# Patient Record
Sex: Male | Born: 1955 | Race: White | Hispanic: No | Marital: Married | Smoking: Never smoker
Health system: Southern US, Community
[De-identification: ages and names within clinical notes are randomized; demographics above are authoritative.]

## PROBLEM LIST (undated history)

## (undated) DIAGNOSIS — N200 Calculus of kidney: Secondary | ICD-10-CM

## (undated) DIAGNOSIS — C801 Malignant (primary) neoplasm, unspecified: Secondary | ICD-10-CM

## (undated) DIAGNOSIS — H9190 Unspecified hearing loss, unspecified ear: Secondary | ICD-10-CM

## (undated) DIAGNOSIS — E079 Disorder of thyroid, unspecified: Secondary | ICD-10-CM

## (undated) HISTORY — PX: COLONOSCOPY: SHX174

---

## 1975-06-06 HISTORY — PX: TONSILLECTOMY: SUR1361

## 1999-06-06 HISTORY — PX: TESTICLE REMOVAL: SHX68

## 2002-06-05 HISTORY — PX: KNEE ARTHROSCOPY: SHX127

## 2005-04-10 ENCOUNTER — Ambulatory Visit: Payer: Self-pay | Admitting: Hematology & Oncology

## 2005-05-08 ENCOUNTER — Ambulatory Visit (HOSPITAL_COMMUNITY): Admission: RE | Admit: 2005-05-08 | Discharge: 2005-05-08 | Payer: Self-pay | Admitting: Hematology & Oncology

## 2005-10-13 ENCOUNTER — Ambulatory Visit: Payer: Self-pay | Admitting: Hematology & Oncology

## 2006-04-27 ENCOUNTER — Ambulatory Visit: Payer: Self-pay | Admitting: Hematology & Oncology

## 2006-04-30 ENCOUNTER — Ambulatory Visit (HOSPITAL_COMMUNITY): Admission: RE | Admit: 2006-04-30 | Discharge: 2006-04-30 | Payer: Self-pay | Admitting: Hematology & Oncology

## 2006-06-05 HISTORY — PX: LITHOTRIPSY: SUR834

## 2007-04-25 ENCOUNTER — Ambulatory Visit: Payer: Self-pay | Admitting: Hematology & Oncology

## 2007-04-29 ENCOUNTER — Ambulatory Visit (HOSPITAL_COMMUNITY): Admission: RE | Admit: 2007-04-29 | Discharge: 2007-04-29 | Payer: Self-pay | Admitting: Hematology & Oncology

## 2007-04-29 LAB — CBC WITH DIFFERENTIAL/PLATELET
Basophils Absolute: 0 10*3/uL (ref 0.0–0.1)
EOS%: 2.3 % (ref 0.0–7.0)
Eosinophils Absolute: 0.1 10*3/uL (ref 0.0–0.5)
HCT: 38.2 % — ABNORMAL LOW (ref 38.7–49.9)
HGB: 12.7 g/dL — ABNORMAL LOW (ref 13.0–17.1)
MCH: 21.9 pg — ABNORMAL LOW (ref 28.0–33.4)
MCV: 65.8 fL — ABNORMAL LOW (ref 81.6–98.0)
MONO%: 7.4 % (ref 0.0–13.0)
NEUT#: 3.1 10*3/uL (ref 1.5–6.5)
NEUT%: 62.6 % (ref 40.0–75.0)
Platelets: 209 10*3/uL (ref 145–400)

## 2007-04-29 LAB — COMPREHENSIVE METABOLIC PANEL
AST: 34 U/L (ref 0–37)
Albumin: 4.6 g/dL (ref 3.5–5.2)
Alkaline Phosphatase: 54 U/L (ref 39–117)
BUN: 21 mg/dL (ref 6–23)
Calcium: 10 mg/dL (ref 8.4–10.5)
Creatinine, Ser: 1.1 mg/dL (ref 0.40–1.50)
Glucose, Bld: 112 mg/dL — ABNORMAL HIGH (ref 70–99)
Potassium: 4.5 mEq/L (ref 3.5–5.3)

## 2008-04-14 ENCOUNTER — Ambulatory Visit: Payer: Self-pay | Admitting: Hematology & Oncology

## 2008-04-16 ENCOUNTER — Ambulatory Visit (HOSPITAL_COMMUNITY): Admission: RE | Admit: 2008-04-16 | Discharge: 2008-04-16 | Payer: Self-pay | Admitting: Hematology & Oncology

## 2008-04-16 LAB — CBC WITH DIFFERENTIAL/PLATELET
Basophils Absolute: 0 10*3/uL (ref 0.0–0.1)
Eosinophils Absolute: 0.1 10*3/uL (ref 0.0–0.5)
HCT: 38.7 % (ref 38.7–49.9)
HGB: 12.5 g/dL — ABNORMAL LOW (ref 13.0–17.1)
LYMPH%: 27.5 % (ref 14.0–48.0)
MCV: 67.8 fL — ABNORMAL LOW (ref 81.6–98.0)
MONO#: 0.4 10*3/uL (ref 0.1–0.9)
MONO%: 7.2 % (ref 0.0–13.0)
NEUT#: 3.1 10*3/uL (ref 1.5–6.5)
NEUT%: 63.8 % (ref 40.0–75.0)
Platelets: 223 10*3/uL (ref 145–400)
RBC: 5.71 10*6/uL (ref 4.20–5.71)

## 2008-04-16 LAB — COMPREHENSIVE METABOLIC PANEL
ALT: 32 U/L (ref 0–53)
AST: 28 U/L (ref 0–37)
Albumin: 4.6 g/dL (ref 3.5–5.2)
Alkaline Phosphatase: 46 U/L (ref 39–117)
Calcium: 9.9 mg/dL (ref 8.4–10.5)
Chloride: 103 mEq/L (ref 96–112)
Potassium: 4.3 mEq/L (ref 3.5–5.3)
Sodium: 140 mEq/L (ref 135–145)
Total Protein: 7.5 g/dL (ref 6.0–8.3)

## 2008-04-19 LAB — AFP TUMOR MARKER: AFP-Tumor Marker: 3.3 ng/mL (ref 0.0–8.0)

## 2008-04-19 LAB — BETA HCG QUANT (REF LAB): Beta hCG, Tumor Marker: <0.5 m[IU]/mL (ref <5.0)

## 2008-04-22 ENCOUNTER — Ambulatory Visit: Payer: Self-pay | Admitting: Hematology & Oncology

## 2009-06-01 ENCOUNTER — Ambulatory Visit: Payer: Self-pay | Admitting: Hematology & Oncology

## 2009-06-02 ENCOUNTER — Ambulatory Visit: Payer: Self-pay | Admitting: Diagnostic Radiology

## 2009-06-02 ENCOUNTER — Ambulatory Visit (HOSPITAL_BASED_OUTPATIENT_CLINIC_OR_DEPARTMENT_OTHER): Admission: RE | Admit: 2009-06-02 | Discharge: 2009-06-02 | Payer: Self-pay | Admitting: Hematology & Oncology

## 2009-06-02 LAB — CBC WITH DIFFERENTIAL (CANCER CENTER ONLY)
BASO%: 0.4 % (ref 0.0–2.0)
HCT: 37 % — ABNORMAL LOW (ref 38.7–49.9)
LYMPH%: 32.7 % (ref 14.0–48.0)
MCH: 21.8 pg — ABNORMAL LOW (ref 28.0–33.4)
MCV: 65 fL — ABNORMAL LOW (ref 82–98)
MONO#: 0.3 10*3/uL (ref 0.1–0.9)
MONO%: 6 % (ref 0.0–13.0)
NEUT%: 58.5 % (ref 40.0–80.0)
RDW: 16 % — ABNORMAL HIGH (ref 10.5–14.6)
WBC: 5.1 10*3/uL (ref 4.0–10.0)

## 2009-06-02 LAB — CMP (CANCER CENTER ONLY)
ALT(SGPT): 31 U/L (ref 10–47)
AST: 31 U/L (ref 11–38)
Alkaline Phosphatase: 45 U/L (ref 26–84)
BUN, Bld: 22 mg/dL (ref 7–22)
Calcium: 9.2 mg/dL (ref 8.0–10.3)
Chloride: 103 mEq/L (ref 98–108)
Creat: 1 mg/dl (ref 0.6–1.2)
Potassium: 4.4 mEq/L (ref 3.3–4.7)

## 2009-06-04 LAB — AFP TUMOR MARKER: AFP-Tumor Marker: 3.7 ng/mL (ref 0.0–8.0)

## 2010-06-01 ENCOUNTER — Ambulatory Visit: Payer: Self-pay | Admitting: Hematology & Oncology

## 2010-06-02 LAB — CBC WITH DIFFERENTIAL (CANCER CENTER ONLY)
BASO#: 0 10*3/uL (ref 0.0–0.2)
Eosinophils Absolute: 0.1 10*3/uL (ref 0.0–0.5)
HCT: 38.8 % (ref 38.7–49.9)
HGB: 12.6 g/dL — ABNORMAL LOW (ref 13.0–17.1)
LYMPH%: 33.1 % (ref 14.0–48.0)
MCH: 21.8 pg — ABNORMAL LOW (ref 28.0–33.4)
MCV: 67 fL — ABNORMAL LOW (ref 82–98)
MONO#: 0.3 10*3/uL (ref 0.1–0.9)
MONO%: 7.2 % (ref 0.0–13.0)
RBC: 5.78 10*6/uL — ABNORMAL HIGH (ref 4.20–5.70)
WBC: 4 10*3/uL (ref 4.0–10.0)

## 2010-06-04 LAB — COMPREHENSIVE METABOLIC PANEL
Albumin: 4.3 g/dL (ref 3.5–5.2)
Alkaline Phosphatase: 50 U/L (ref 39–117)
BUN: 28 mg/dL — ABNORMAL HIGH (ref 6–23)
CO2: 28 mEq/L (ref 19–32)
Glucose, Bld: 106 mg/dL — ABNORMAL HIGH (ref 70–99)
Total Bilirubin: 0.6 mg/dL (ref 0.3–1.2)
Total Protein: 6.8 g/dL (ref 6.0–8.3)

## 2010-06-04 LAB — BETA HCG QUANT (REF LAB): Beta hCG, Tumor Marker: <0.5 m[IU]/mL (ref <5.0)

## 2010-06-04 LAB — AFP TUMOR MARKER: AFP-Tumor Marker: 3.4 ng/mL (ref 0.0–8.0)

## 2010-06-04 LAB — LACTATE DEHYDROGENASE: LDH: 104 U/L (ref 94–250)

## 2011-06-06 HISTORY — PX: KNEE ARTHROSCOPY: SUR90

## 2013-02-28 ENCOUNTER — Other Ambulatory Visit: Payer: Self-pay | Admitting: Orthopedic Surgery

## 2013-02-28 ENCOUNTER — Encounter (HOSPITAL_BASED_OUTPATIENT_CLINIC_OR_DEPARTMENT_OTHER): Payer: Self-pay | Admitting: *Deleted

## 2013-02-28 NOTE — Progress Notes (Signed)
No health problems  

## 2013-03-03 ENCOUNTER — Encounter (HOSPITAL_BASED_OUTPATIENT_CLINIC_OR_DEPARTMENT_OTHER)
Admission: RE | Disposition: A | Discharge status: Home / Self Care | Payer: Self-pay | Source: Ambulatory Visit | Attending: Orthopedic Surgery

## 2013-03-03 ENCOUNTER — Ambulatory Visit (HOSPITAL_BASED_OUTPATIENT_CLINIC_OR_DEPARTMENT_OTHER): Payer: BC Managed Care – PPO | Admitting: Anesthesiology

## 2013-03-03 ENCOUNTER — Encounter (HOSPITAL_BASED_OUTPATIENT_CLINIC_OR_DEPARTMENT_OTHER): Payer: Self-pay | Admitting: Anesthesiology

## 2013-03-03 ENCOUNTER — Encounter (HOSPITAL_BASED_OUTPATIENT_CLINIC_OR_DEPARTMENT_OTHER): Payer: Self-pay | Admitting: *Deleted

## 2013-03-03 ENCOUNTER — Ambulatory Visit (HOSPITAL_COMMUNITY): Payer: BC Managed Care – PPO

## 2013-03-03 ENCOUNTER — Ambulatory Visit (HOSPITAL_BASED_OUTPATIENT_CLINIC_OR_DEPARTMENT_OTHER)
Admission: RE | Admit: 2013-03-03 | Discharge: 2013-03-03 | Disposition: A | Discharge status: Home / Self Care | Payer: BC Managed Care – PPO | Source: Ambulatory Visit | Attending: Orthopedic Surgery | Admitting: Orthopedic Surgery

## 2013-03-03 DIAGNOSIS — S62609A Fracture of unspecified phalanx of unspecified finger, initial encounter for closed fracture: Secondary | ICD-10-CM | POA: Insufficient documentation

## 2013-03-03 DIAGNOSIS — IMO0002 Reserved for concepts with insufficient information to code with codable children: Secondary | ICD-10-CM | POA: Insufficient documentation

## 2013-03-03 DIAGNOSIS — W219XXA Striking against or struck by unspecified sports equipment, initial encounter: Secondary | ICD-10-CM | POA: Insufficient documentation

## 2013-03-03 DIAGNOSIS — Y9364 Activity, baseball: Secondary | ICD-10-CM | POA: Insufficient documentation

## 2013-03-03 DIAGNOSIS — H9190 Unspecified hearing loss, unspecified ear: Secondary | ICD-10-CM | POA: Insufficient documentation

## 2013-03-03 HISTORY — DX: Malignant (primary) neoplasm, unspecified: C80.1

## 2013-03-03 HISTORY — PX: OPEN REDUCTION INTERNAL FIXATION (ORIF) PROXIMAL PHALANX: SHX6235

## 2013-03-03 HISTORY — DX: Unspecified hearing loss, unspecified ear: H91.90

## 2013-03-03 LAB — POCT HEMOGLOBIN-HEMACUE: Hemoglobin: 11.5 g/dL — ABNORMAL LOW (ref 13.0–17.0)

## 2013-03-03 SURGERY — OPEN REDUCTION INTERNAL FIXATION (ORIF) PROXIMAL PHALANX
Anesthesia: General | Site: Finger | Laterality: Right | Wound class: Clean

## 2013-03-03 MED ORDER — HYDROMORPHONE HCL PF 1 MG/ML IJ SOLN
0.2500 mg | INTRAMUSCULAR | Status: DC | PRN
  Administered 2013-03-03: 0.25 mg via INTRAVENOUS
  Administered 2013-03-03: 0.5 mg via INTRAVENOUS

## 2013-03-03 MED ORDER — FENTANYL CITRATE 0.05 MG/ML IJ SOLN: 50.0000 ug | Freq: Once | INTRAMUSCULAR | Status: DC

## 2013-03-03 MED ORDER — LIDOCAINE HCL (CARDIAC) 20 MG/ML IV SOLN
INTRAVENOUS | Status: DC | PRN
  Administered 2013-03-03: 100 mg via INTRAVENOUS

## 2013-03-03 MED ORDER — ONDANSETRON HCL 4 MG/2ML IJ SOLN
INTRAMUSCULAR | Status: DC | PRN
  Administered 2013-03-03: 4 mg via INTRAVENOUS

## 2013-03-03 MED ORDER — OXYCODONE HCL 5 MG PO TABS: 5.0000 mg | ORAL_TABLET | Freq: Once | ORAL | Status: DC | PRN

## 2013-03-03 MED ORDER — CEFAZOLIN SODIUM-DEXTROSE 2-3 GM-% IV SOLR
2.0000 g | INTRAVENOUS | Status: AC
  Administered 2013-03-03: 2 g via INTRAVENOUS

## 2013-03-03 MED ORDER — MIDAZOLAM HCL 5 MG/5ML IJ SOLN
INTRAMUSCULAR | Status: DC | PRN
  Administered 2013-03-03: 2 mg via INTRAVENOUS

## 2013-03-03 MED ORDER — PROPOFOL 10 MG/ML IV BOLUS
INTRAVENOUS | Status: DC | PRN
  Administered 2013-03-03: 200 mg via INTRAVENOUS

## 2013-03-03 MED ORDER — MIDAZOLAM HCL 2 MG/2ML IJ SOLN: 1.0000 mg | INTRAMUSCULAR | Status: DC | PRN

## 2013-03-03 MED ORDER — OXYCODONE HCL 5 MG/5ML PO SOLN: 5.0000 mg | Freq: Once | ORAL | Status: DC | PRN

## 2013-03-03 MED ORDER — PROMETHAZINE HCL 25 MG/ML IJ SOLN: 6.2500 mg | INTRAMUSCULAR | Status: DC | PRN

## 2013-03-03 MED ORDER — LACTATED RINGERS IV SOLN
INTRAVENOUS | Status: DC
  Administered 2013-03-03: 07:00:00 via INTRAVENOUS

## 2013-03-03 MED ORDER — BUPIVACAINE-EPINEPHRINE PF 0.5-1:200000 % IJ SOLN
INTRAMUSCULAR | Status: DC | PRN
  Administered 2013-03-03: 8 mL

## 2013-03-03 MED ORDER — FENTANYL CITRATE 0.05 MG/ML IJ SOLN
INTRAMUSCULAR | Status: DC | PRN
  Administered 2013-03-03: 100 ug via INTRAVENOUS

## 2013-03-03 MED ORDER — DEXAMETHASONE SODIUM PHOSPHATE 10 MG/ML IJ SOLN
INTRAMUSCULAR | Status: DC | PRN
  Administered 2013-03-03: 10 mg via INTRAVENOUS

## 2013-03-03 MED ORDER — EPHEDRINE SULFATE 50 MG/ML IJ SOLN
INTRAMUSCULAR | Status: DC | PRN
  Administered 2013-03-03: 10 mg via INTRAVENOUS

## 2013-03-03 SURGICAL SUPPLY — 44 items
ANCH SUT 2-0 MN NDL DRL PLSTR (Anchor) ×1 IMPLANT
ANCH SUT 3-0 MN 1 LD NDL (Anchor) ×1 IMPLANT
ANCHOR JUGGERKNOT 1.0 1DR 2-0 (Anchor) ×1 IMPLANT
ANCHOR JUGGERKNOT 1.0 3-0 NLD (Anchor) ×1 IMPLANT
BANDAGE COBAN STERILE 2 (GAUZE/BANDAGES/DRESSINGS) IMPLANT
BANDAGE GAUZE ELAST BULKY 4 IN (GAUZE/BANDAGES/DRESSINGS) ×2 IMPLANT
BLADE MINI RND TIP GREEN BEAV (BLADE) IMPLANT
BLADE SURG 15 STRL LF DISP TIS (BLADE) ×1 IMPLANT
BLADE SURG 15 STRL SS (BLADE) ×2
BNDG CMPR 9X4 STRL LF SNTH (GAUZE/BANDAGES/DRESSINGS)
BNDG COHESIVE 4X5 TAN STRL (GAUZE/BANDAGES/DRESSINGS) ×1 IMPLANT
BNDG ESMARK 4X9 LF (GAUZE/BANDAGES/DRESSINGS) IMPLANT
CHLORAPREP W/TINT 26ML (MISCELLANEOUS) ×2 IMPLANT
COVER MAYO STAND STRL (DRAPES) ×2 IMPLANT
COVER TABLE BACK 60X90 (DRAPES) ×2 IMPLANT
CUFF TOURNIQUET SINGLE 18IN (TOURNIQUET CUFF) ×1 IMPLANT
DRAPE C-ARM 42X72 X-RAY (DRAPES) ×2 IMPLANT
DRAPE EXTREMITY T 121X128X90 (DRAPE) ×2 IMPLANT
DRAPE SURG 17X23 STRL (DRAPES) ×2 IMPLANT
DRSG EMULSION OIL 3X3 NADH (GAUZE/BANDAGES/DRESSINGS) ×2 IMPLANT
GLOVE BIO SURGEON STRL SZ 6.5 (GLOVE) ×1 IMPLANT
GLOVE BIO SURGEON STRL SZ7.5 (GLOVE) ×2 IMPLANT
GLOVE BIOGEL PI IND STRL 7.0 (GLOVE) IMPLANT
GLOVE BIOGEL PI IND STRL 8 (GLOVE) ×1 IMPLANT
GLOVE BIOGEL PI INDICATOR 7.0 (GLOVE) ×1
GLOVE BIOGEL PI INDICATOR 8 (GLOVE) ×1
GOWN PREVENTION PLUS XLARGE (GOWN DISPOSABLE) ×3 IMPLANT
NDL HYPO 25X1 1.5 SAFETY (NEEDLE) IMPLANT
NEEDLE HYPO 25X1 1.5 SAFETY (NEEDLE) ×2 IMPLANT
NS IRRIG 1000ML POUR BTL (IV SOLUTION) ×2 IMPLANT
PACK BASIN DAY SURGERY FS (CUSTOM PROCEDURE TRAY) ×2 IMPLANT
PAD CAST 4YDX4 CTTN HI CHSV (CAST SUPPLIES) ×1 IMPLANT
PADDING CAST ABS 4INX4YD NS (CAST SUPPLIES)
PADDING CAST ABS COTTON 4X4 ST (CAST SUPPLIES) IMPLANT
PADDING CAST COTTON 4X4 STRL (CAST SUPPLIES) ×2
RUBBERBAND STERILE (MISCELLANEOUS) ×2 IMPLANT
SPONGE GAUZE 4X4 12PLY (GAUZE/BANDAGES/DRESSINGS) ×2 IMPLANT
STOCKINETTE 4X48 STRL (DRAPES) ×2 IMPLANT
SUT VICRYL RAPIDE 4/0 PS 2 (SUTURE) ×1 IMPLANT
SYR BULB 3OZ (MISCELLANEOUS) ×1 IMPLANT
SYRINGE 10CC LL (SYRINGE) IMPLANT
TOWEL OR 17X24 6PK STRL BLUE (TOWEL DISPOSABLE) ×2 IMPLANT
TOWEL OR NON WOVEN STRL DISP B (DISPOSABLE) ×2 IMPLANT
UNDERPAD 30X30 INCONTINENT (UNDERPADS AND DIAPERS) ×2 IMPLANT

## 2013-03-03 NOTE — Anesthesia Postprocedure Evaluation (Signed)
  Anesthesia Post-op Note  Patient: Miguel Dillon  Procedure(s) Performed: Procedure(s): ORIF RIGHT SMALL FINGER P3 FRACTURE (Right)  Patient Location: PACU  Anesthesia Type:General  Level of Consciousness: awake and alert   Airway and Oxygen Therapy: Patient Spontanous Breathing  Post-op Pain: mild  Post-op Assessment: Post-op Vital signs reviewed, Patient's Cardiovascular Status Stable, Respiratory Function Stable, Patent Airway, No signs of Nausea or vomiting and Pain level controlled  Post-op Vital Signs: stable  Complications: No apparent anesthesia complications

## 2013-03-03 NOTE — Anesthesia Preprocedure Evaluation (Addendum)
Anesthesia Evaluation  Patient identified by MRN, date of birth, ID band Patient awake    Reviewed: Allergy & Precautions, H&P , NPO status , Patient's Chart, lab work & pertinent test results  History of Anesthesia Complications Negative for: history of anesthetic complications  Airway        Dental   Pulmonary neg pulmonary ROS,          Cardiovascular negative cardio ROS      Neuro/Psych negative neurological ROS  negative psych ROS   GI/Hepatic   Endo/Other    Renal/GU      Musculoskeletal   Abdominal   Peds  Hematology   Anesthesia Other Findings   Reproductive/Obstetrics                             Anesthesia Physical Anesthesia Plan Anesthesia Quick Evaluation  

## 2013-03-03 NOTE — Anesthesia Procedure Notes (Signed)
Procedure Name: LMA Insertion Date/Time: 03/03/2013 7:38 AM Performed by: Burna Cash Pre-anesthesia Checklist: Patient identified, Emergency Drugs available, Suction available and Patient being monitored Patient Re-evaluated:Patient Re-evaluated prior to inductionOxygen Delivery Method: Circle System Utilized Preoxygenation: Pre-oxygenation with 100% oxygen Intubation Type: IV induction Ventilation: Mask ventilation without difficulty LMA: LMA inserted LMA Size: 5.0 Number of attempts: 1 Airway Equipment and Method: bite block Placement Confirmation: positive ETCO2 Tube secured with: Tape Dental Injury: Teeth and Oropharynx as per pre-operative assessment

## 2013-03-03 NOTE — Op Note (Signed)
03/03/2013  6:01 AM  PATIENT:  Miguel Dillon  57 y.o. male  PRE-OPERATIVE DIAGNOSIS:  Right small finger bony mallet injury  POST-OPERATIVE DIAGNOSIS:  Same  PROCEDURE:  ORIF right small finger bony mallet injury  SURGEON: Cliffton Asters. Janee Morn, MD  PHYSICIAN ASSISTANT: None  ANESTHESIA:  general  SPECIMENS:  None  DRAINS:   None  PREOPERATIVE INDICATIONS:  Miguel Dillon is a  56 y.o. male with a diagnosis of displaced right small finger bony mallet injury with a swan-neck posture.  The risks benefits and alternatives were discussed with the patient preoperatively including but not limited to the risks of infection, bleeding, nerve injury, cardiopulmonary complications, the need for revision surgery, among others, and the patient verbalized understanding and consented to proceed.  OPERATIVE IMPLANTS: 2 Bioment mini juggar- knot suture anchors x2, 0.045 inch K wire  OPERATIVE FINDINGS: The terminal extensor tendon terminated with 2 very small dorsal fragments which were unable to be retained by wires themselves.  OPERATIVE PROCEDURE:  After receiving prophylactic antibiotics, the patient was escorted to the operative theatre and placed in a supine position.  General anesthesia was administered A surgical "time-out" was performed during which the planned procedure, proposed operative site, and the correct patient identity were compared to the operative consent and agreement confirmed by the circulating nurse according to current facility policy.  Following application of a tourniquet to the operative extremity, the exposed skin was prepped with Chloraprep and draped in the usual sterile fashion.  The limb was exsanguinated with an Esmarch bandage and the tourniquet inflated to approximately higher than systolic BP.  A dorsal H. shaped incision was marked and made sharply over the DIP joint. The distal flap was elevated and retracted. There had begun some early healing already with  extensor tendon which was disrupted. The intervening clot and early fibrous tissue was debrided from the gap between the fragments and their bed. The fragments could be reduced into their bed with near-anatomic alignment. The fragments were judged to be too small to hold independent K wire fixation. 2 different mini juggar- knots were placed on each half of the tendon radially and ulnarly. A 0.045 inch K wire was driven down the distal phalanx of the tip of the digit to the articular surface. The DIP joint was irrigated. It was reduced into extension and the K wire was driven longitudinally securing that. The fracture fragments were tucked into their bed and secured with a 2 sutures emanating from the juggar-knots.    Final fluoroscopic images were obtained. The wound is irrigated, additional hemostasis obtained, and the skin closed with 4-0 Vicryl Rapide interrupted sutures. Half percent Marcaine with epinephrine was instilled into the base of the digit to provide for postoperative analgesia. The pin was bent over at the end of the digit and clipped so that the exposed portion rested over the nail plate. A finger dressing was applied with a tongue depressor component as a splint and he was awakened and taken to room stable condition  DISPOSITION: Will be discharged home today return on Wednesday to hand therapy to have a custom molded protective fingertip splint constructed in than out like to check his wound and his status in roughly 2 weeks at that time he should have x-rays of the right small finger out of splint.

## 2013-03-03 NOTE — Transfer of Care (Signed)
Immediate Anesthesia Transfer of Care Note  Patient: Miguel Dillon  Procedure(s) Performed: Procedure(s): ORIF RIGHT SMALL FINGER P3 FRACTURE (Right)  Patient Location: PACU  Anesthesia Type:General  Level of Consciousness: sedated  Airway & Oxygen Therapy: Patient Spontanous Breathing and Patient connected to face mask oxygen  Post-op Assessment: Report given to PACU RN and Post -op Vital signs reviewed and stable  Post vital signs: Reviewed and stable  Complications: No apparent anesthesia complications

## 2013-03-03 NOTE — H&P (Signed)
Miguel Dillon is an 57 y.o. male.   CC / Reason for Visit: Right small finger injury HPI: This patient is a 57 year old LHD male who is very active and injured his right small finger playing softball approximately 2 weeks ago.  [facility] [place] and x-rays obtained indicating a bony mallet injury.  He presents today for evaluation having intermittently splinted since the time of injury.  Past Medical History  Diagnosis Date  . HOH (hard of hearing)   . Cancer     hx testicular cancer-surgery-chemo-02    Past Surgical History  Procedure Laterality Date  . Testicle removal  2001    right  . Tonsillectomy  1977  . Lithotripsy  2008    cysto stone extraction  . Knee arthroscopy  2004    left  . Knee arthroscopy  2013    right  . Colonoscopy      History reviewed. No pertinent family history. Social History:  reports that he has never smoked. He does not have any smokeless tobacco history on file. He reports that  drinks alcohol. He reports that he does not use illicit drugs.  Allergies: No Known Allergies  No prescriptions prior to admission    No results found for this or any previous visit (from the past 48 hour(s)). No results found.  Review of Systems  All other systems reviewed and are negative.    Height 5\' 11"  (1.803 m), weight 79.379 kg (175 lb). Physical Exam   Constitutional:  WD, WN, NAD HEENT:  NCAT, EOMI Neuro/Psych:  Alert & oriented to person, place, and time; appropriate mood & affect Lymphatic: No generalized UE edema or lymphadenopathy Extremities / MSK:  Both UE are normal with respect to appearance, ranges of motion, joint stability, muscle strength/tone, sensation, & perfusion except as otherwise noted:  The right small finger as 30-40 of evident drooping at the DIP joint.  There is enlargement and tenderness dorsally although the ecchymosis has resolved.  There is even a small tendency toward a swan-neck  deformity with a few degrees of hyperextension at the PIP joint  Labs / Xrays:  No radiographic studies obtained today.  X-rays are reviewed revealing a reduced PIP joint with a proximally displaced and slightly rotated bony mallet fragment.  Assessment: Right small finger bony mallet injury with tendency towards swan neck deformity  Plan:  I discussed these findings with the patient.  We discussed the implications of continued closed treatment versus open treatment.  He would like to proceed surgically and this is scheduled for Monday to best accommodate his work schedule out of town.  At some point, it may be helpful to try to transition his postoperative care to provider where he spends the working hours out of state.  The details of the operative procedure were discussed with the patient.  Questions were invited and answered.  In addition to the goal of the procedure, the risks of the procedure to include but not limited to bleeding; infection; damage to the nerves or blood vessels that could result in bleeding, numbness, weakness, chronic pain, and the need for additional procedures; stiffness; the need for revision surgery; and anesthetic risks, the worst of which is death, were reviewed.  No specific outcome was guaranteed or implied.  Informed consent was obtained.    Jenniferann Stuckert A. 03/03/2013, 6:00 AM

## 2013-03-04 ENCOUNTER — Encounter (HOSPITAL_BASED_OUTPATIENT_CLINIC_OR_DEPARTMENT_OTHER): Payer: Self-pay | Admitting: Orthopedic Surgery

## 2013-04-30 ENCOUNTER — Other Ambulatory Visit (HOSPITAL_COMMUNITY): Payer: Self-pay | Admitting: Physical Medicine and Rehabilitation

## 2013-04-30 DIAGNOSIS — M899 Disorder of bone, unspecified: Secondary | ICD-10-CM

## 2013-05-07 ENCOUNTER — Ambulatory Visit (HOSPITAL_COMMUNITY)

## 2013-05-07 ENCOUNTER — Encounter (HOSPITAL_COMMUNITY)

## 2013-05-27 ENCOUNTER — Encounter (HOSPITAL_COMMUNITY)
Admission: RE | Admit: 2013-05-27 | Discharge: 2013-05-27 | Disposition: A | Discharge status: Home / Self Care | Payer: BC Managed Care – PPO | Source: Ambulatory Visit | Attending: Physical Medicine and Rehabilitation | Admitting: Physical Medicine and Rehabilitation

## 2013-05-27 DIAGNOSIS — M899 Disorder of bone, unspecified: Secondary | ICD-10-CM

## 2013-05-27 MED ORDER — TECHNETIUM TC 99M MEDRONATE IV KIT
25.0000 | PACK | Freq: Once | INTRAVENOUS | Status: AC | PRN
  Administered 2013-05-27: 25 via INTRAVENOUS

## 2013-05-27 MED ORDER — TECHNETIUM TC 99M MEDRONATE IV KIT: 25.0000 | PACK | Freq: Once | INTRAVENOUS | Status: AC | PRN

## 2013-10-26 ENCOUNTER — Emergency Department (HOSPITAL_BASED_OUTPATIENT_CLINIC_OR_DEPARTMENT_OTHER): Payer: BC Managed Care – PPO

## 2013-10-26 ENCOUNTER — Emergency Department (HOSPITAL_BASED_OUTPATIENT_CLINIC_OR_DEPARTMENT_OTHER)
Admission: EM | Admit: 2013-10-26 | Discharge: 2013-10-26 | Disposition: A | Discharge status: Home / Self Care | Payer: BC Managed Care – PPO | Attending: Emergency Medicine | Admitting: Emergency Medicine

## 2013-10-26 ENCOUNTER — Encounter (HOSPITAL_BASED_OUTPATIENT_CLINIC_OR_DEPARTMENT_OTHER): Payer: Self-pay | Admitting: Emergency Medicine

## 2013-10-26 DIAGNOSIS — Z8547 Personal history of malignant neoplasm of testis: Secondary | ICD-10-CM | POA: Insufficient documentation

## 2013-10-26 DIAGNOSIS — Y929 Unspecified place or not applicable: Secondary | ICD-10-CM | POA: Insufficient documentation

## 2013-10-26 DIAGNOSIS — S61239A Puncture wound without foreign body of unspecified finger without damage to nail, initial encounter: Secondary | ICD-10-CM

## 2013-10-26 DIAGNOSIS — Z23 Encounter for immunization: Secondary | ICD-10-CM | POA: Insufficient documentation

## 2013-10-26 DIAGNOSIS — H9190 Unspecified hearing loss, unspecified ear: Secondary | ICD-10-CM | POA: Insufficient documentation

## 2013-10-26 DIAGNOSIS — Y9389 Activity, other specified: Secondary | ICD-10-CM | POA: Insufficient documentation

## 2013-10-26 DIAGNOSIS — W298XXA Contact with other powered powered hand tools and household machinery, initial encounter: Secondary | ICD-10-CM | POA: Insufficient documentation

## 2013-10-26 DIAGNOSIS — S61209A Unspecified open wound of unspecified finger without damage to nail, initial encounter: Secondary | ICD-10-CM | POA: Insufficient documentation

## 2013-10-26 MED ORDER — TETANUS-DIPHTH-ACELL PERTUSSIS 5-2.5-18.5 LF-MCG/0.5 IM SUSP
0.5000 mL | Freq: Once | INTRAMUSCULAR | Status: AC
  Administered 2013-10-26: 0.5 mL via INTRAMUSCULAR
  Filled 2013-10-26: qty 0.5

## 2013-10-26 MED ORDER — AMOXICILLIN-POT CLAVULANATE 875-125 MG PO TABS: 1.0000 | ORAL_TABLET | Freq: Two times a day (BID) | ORAL | Status: AC

## 2013-10-26 NOTE — ED Notes (Signed)
MD at bedside. 

## 2013-10-26 NOTE — ED Notes (Signed)
Patient transported to X-ray 

## 2013-10-26 NOTE — Discharge Instructions (Signed)
Puncture Wound °A puncture wound is an injury that extends through all layers of the skin and into the tissue beneath the skin (subcutaneous tissue). Puncture wounds become infected easily because germs often enter the body and go beneath the skin during the injury. Having a deep wound with a small entrance point makes it difficult for your caregiver to adequately clean the wound. This is especially true if you have stepped on a nail and it has passed through a dirty shoe or other situations where the wound is obviously contaminated. °CAUSES  °Many puncture wounds involve glass, nails, splinters, fish hooks, or other objects that enter the skin (foreign bodies). A puncture wound may also be caused by a human bite or animal bite. °DIAGNOSIS  °A puncture wound is usually diagnosed by your history and a physical exam. You may need to have an X-ray or an ultrasound to check for any foreign bodies still in the wound. °TREATMENT  °· Your caregiver will clean the wound as thoroughly as possible. Depending on the location of the wound, a bandage (dressing) may be applied. °· Your caregiver might prescribe antibiotic medicines. °· You may need a follow-up visit to check on your wound. Follow all instructions as directed by your caregiver. °HOME CARE INSTRUCTIONS  °· Change your dressing once per day, or as directed by your caregiver. If the dressing sticks, it may be removed by soaking the area in water. °· If your caregiver has given you follow-up instructions, it is very important that you return for a follow-up appointment. Not following up as directed could result in a chronic or permanent injury, pain, and disability. °· Only take over-the-counter or prescription medicines for pain, discomfort, or fever as directed by your caregiver. °· If you are given antibiotics, take them as directed. Finish them even if you start to feel better. °You may need a tetanus shot if: °· You cannot remember when you had your last tetanus  shot. °· You have never had a tetanus shot. °If you got a tetanus shot, your arm may swell, get red, and feel warm to the touch. This is common and not a problem. If you need a tetanus shot and you choose not to have one, there is a rare chance of getting tetanus. Sickness from tetanus can be serious. °You may need a rabies shot if an animal bite caused your puncture wound. °SEEK MEDICAL CARE IF:  °· You have redness, swelling, or increasing pain in the wound. °· You have red streaks going away from the wound. °· You notice a bad smell coming from the wound or dressing. °· You have yellowish-white fluid (pus) coming from the wound. °· You are treated with an antibiotic for infection, but the infection is not getting better. °· You notice something in the wound, such as rubber from your shoe, cloth, or another object. °· You have a fever. °· You have severe pain. °· You have difficulty breathing. °· You feel dizzy or faint. °· You cannot stop vomiting. °· You lose feeling, develop numbness, or cannot move a limb below the wound. °· Your symptoms worsen. °MAKE SURE YOU: °· Understand these instructions. °· Will watch your condition. °· Will get help right away if you are not doing well or get worse. °Document Released: 03/01/2005 Document Revised: 08/14/2011 Document Reviewed: 11/08/2010 °[employer]® Patient Information ©2014 [employer]. ° °

## 2013-10-26 NOTE — ED Notes (Addendum)
appx 3 hours ago was injured by a staple gun to his left ring finger. No entrance wounds noticeable, but finger is swollen, reddened and painful. Needs tetanus shot

## 2013-10-26 NOTE — ED Provider Notes (Signed)
CSN: [id number]     Arrival date & time 10/26/13  1617 History  This chart was scribed for Neta Ehlers, MD by Vernell Barrier, ED scribe. This patient was seen in room MH01/MH01 and the patient's care was started at 4:50 PM.   Chief Complaint  Patient presents with  . Finger Injury     HPI Comments: Pt accidentily shot himself in the palmar side of R ring finger w/ a staple gun several hours ago. He removed staple, but has since had increased pain, swelling and dec ROM.   Patient is a 58 y.o. male presenting with hand pain. The history is provided by the patient.  Hand Pain This is a new problem. The current episode started 3 to 5 hours ago. The problem has been gradually worsening. Pertinent negatives include no chest pain, no abdominal pain, no headaches and no shortness of breath. Exacerbated by: flexing finger. The symptoms are relieved by rest. He has tried nothing for the symptoms. The treatment provided no relief.   HPI Comments: Miguel Dillon is a 58 y.o. male who presents to the Emergency Department complaining of pain to the left ring finger w/ associated redness and swelling; onset approximately 3 hours ago. Pt states he was injured by a staple gun.   Past Medical History  Diagnosis Date  . HOH (hard of hearing)   . Cancer     hx testicular cancer-surgery-chemo-02   Past Surgical History  Procedure Laterality Date  . Testicle removal  2001    right  . Tonsillectomy  1977  . Lithotripsy  2008    cysto stone extraction  . Knee arthroscopy  2004    left  . Knee arthroscopy  2013    right  . Colonoscopy    . Open reduction internal fixation (orif) proximal phalanx Right 03/03/2013    Procedure: ORIF RIGHT SMALL FINGER P3 FRACTURE;  Surgeon: Jolyn Nap, MD;  Location: [facility];  Service: Orthopedics;  Laterality: Right;   No family history on file. History  Substance Use Topics  . Smoking status: Never Smoker   . Smokeless tobacco: Not on  file  . Alcohol Use: Yes     Comment: occ    Review of Systems  Constitutional: Negative for fever, activity change, appetite change and fatigue.  HENT: Negative for congestion, facial swelling, rhinorrhea and trouble swallowing.   Eyes: Negative for photophobia and pain.  Respiratory: Negative for cough, chest tightness and shortness of breath.   Cardiovascular: Negative for chest pain and leg swelling.  Gastrointestinal: Negative for nausea, vomiting, abdominal pain, diarrhea and constipation.  Endocrine: Negative for polydipsia and polyuria.  Genitourinary: Negative for dysuria, urgency, decreased urine volume and difficulty urinating.  Musculoskeletal: Negative for back pain and gait problem.  Skin: Negative for color change, rash and wound.  Allergic/Immunologic: Negative for immunocompromised state.  Neurological: Negative for dizziness, facial asymmetry, speech difficulty, weakness, numbness and headaches.  Psychiatric/Behavioral: Negative for confusion, decreased concentration and agitation.   Allergies  Dilaudid  Home Medications   Prior to Admission medications   Medication Sig Start Date End Date Taking? Authorizing Provider  oxyCODONE-acetaminophen (PERCOCET/ROXICET) 5-325 MG per tablet Take 1 tablet by mouth every 4 (four) hours as needed for pain.    Historical Provider, MD   Triage Vitals: BP 129/81  Pulse 90  Temp(Src) 98.9 F (37.2 C) (Oral)  Resp 20  Ht 5\' 11"  (1.803 m)  Wt 185 lb (83.915 kg)  BMI 25.81 kg/m2  SpO2 98% Physical Exam  Nursing note and vitals reviewed. Constitutional: He is oriented to person, place, and time. He appears well-developed and well-nourished. No distress.  HENT:  Head: Normocephalic and atraumatic.  Mouth/Throat: No oropharyngeal exudate.  Eyes: Pupils are equal, round, and reactive to light.  Neck: Normal range of motion. Neck supple.  Cardiovascular: Normal rate, regular rhythm and normal heart sounds.  Exam reveals no  gallop and no friction rub.   No murmur heard. Pulmonary/Chest: Effort normal and breath sounds normal. No respiratory distress. He has no wheezes. He has no rales.  Abdominal: Soft. Bowel sounds are normal. He exhibits no distension and no mass. There is no tenderness. There is no rebound and no guarding.  Musculoskeletal: Normal range of motion. He exhibits no edema.       Right hand: He exhibits tenderness and swelling. He exhibits normal range of motion, no bony tenderness and normal capillary refill. Normal sensation noted. Normal strength noted.       Hands: Puncture wounds (2)   Neurological: He is alert and oriented to person, place, and time.  Skin: Skin is warm and dry.  Psychiatric: He has a normal mood and affect.    ED Course  Procedures (including critical care time) DIAGNOSTIC STUDIES: Oxygen Saturation is 98% on room air, normal by my interpretation.    COORDINATION OF CARE: At .4:50 PM: Discussed treatment plan with patient which includes antibiotics ppx, ice, elevation and observation for wound/finger infection. Patient agrees.   Labs Review Labs Reviewed - No data to display  Imaging Review Dg Finger Ring Left  10/26/2013   CLINICAL DATA:  Finger injury  EXAM: LEFT RING FINGER 2+V  COMPARISON:  None.  FINDINGS: Three views of left fourth finger submitted. No acute fracture or subluxation. No radiopaque foreign body. Mild degenerative changes distal interphalangeal joint.  IMPRESSION: No acute fracture or subluxation. Mild degenerative changes distal interphalangeal joint.   Electronically Signed   By: Lahoma Crocker M.D.   On: 10/26/2013 16:46     EKG Interpretation None      MDM   Final diagnoses:  Puncture wound of finger of left hand    Pt is a 58 y.o. male with Pmhx as above who presents with pain or palmar side of right ringer finger after be accidentally shot himself in the finger with a staple gun. He has some pain, redness over the site and reports dec  ROM, though on exam, does NOT have dec ROM. NVI distally. XR nml.  Wound irrigated. As these are puncture wounds, will start ppx augmentin. Will d/c home with instructions for wound care, ice, elevation.  Return precautions given for new or worsening symptoms including worsening pain, redness, dec ROM, fever.            Neta Ehlers, MD 10/26/13 1650

## 2013-10-26 NOTE — ED Notes (Signed)
rx x 1 given for augmentin

## 2014-01-18 ENCOUNTER — Encounter (HOSPITAL_BASED_OUTPATIENT_CLINIC_OR_DEPARTMENT_OTHER): Payer: Self-pay | Admitting: Emergency Medicine

## 2014-01-18 ENCOUNTER — Emergency Department (HOSPITAL_BASED_OUTPATIENT_CLINIC_OR_DEPARTMENT_OTHER)
Admission: EM | Admit: 2014-01-18 | Discharge: 2014-01-18 | Disposition: A | Discharge status: Home / Self Care | Payer: BC Managed Care – PPO | Attending: Emergency Medicine | Admitting: Emergency Medicine

## 2014-01-18 DIAGNOSIS — Z79899 Other long term (current) drug therapy: Secondary | ICD-10-CM | POA: Insufficient documentation

## 2014-01-18 DIAGNOSIS — Z792 Long term (current) use of antibiotics: Secondary | ICD-10-CM | POA: Diagnosis not present

## 2014-01-18 DIAGNOSIS — L255 Unspecified contact dermatitis due to plants, except food: Secondary | ICD-10-CM | POA: Diagnosis not present

## 2014-01-18 DIAGNOSIS — Z8547 Personal history of malignant neoplasm of testis: Secondary | ICD-10-CM | POA: Diagnosis not present

## 2014-01-18 DIAGNOSIS — L237 Allergic contact dermatitis due to plants, except food: Secondary | ICD-10-CM

## 2014-01-18 DIAGNOSIS — IMO0002 Reserved for concepts with insufficient information to code with codable children: Secondary | ICD-10-CM | POA: Insufficient documentation

## 2014-01-18 DIAGNOSIS — H9190 Unspecified hearing loss, unspecified ear: Secondary | ICD-10-CM | POA: Insufficient documentation

## 2014-01-18 MED ORDER — PREDNISONE 10 MG PO TABS: 20.0000 mg | ORAL_TABLET | Freq: Every day | ORAL | Status: AC

## 2014-01-18 MED ORDER — PREDNISONE 50 MG PO TABS
60.0000 mg | ORAL_TABLET | Freq: Once | ORAL | Status: AC
  Administered 2014-01-18: 60 mg via ORAL
  Filled 2014-01-18 (×2): qty 1

## 2014-01-18 NOTE — ED Notes (Signed)
Patient has poison ivy, legs, arms, torso

## 2014-01-18 NOTE — ED Provider Notes (Signed)
CSN: [id number]     Arrival date & time 01/18/14  0703 History   First MD Initiated Contact with Patient 01/18/14 [phone]     Chief Complaint  Patient presents with  . Poison Ivy     (Consider location/radiation/quality/duration/timing/severity/associated sxs/prior Treatment) HPI Pt presenting with c/o rash c/w poison ivy.  Pt states symptoms have been ongoing for the past several days. He has tried benadryl po as well as benadryl spray wiith mild temporary relief.  No lip or tongue swelling no shortness of breath or facial or eye involvement.  Pt states he was cutting shrubs when symptoms began.  ITching is constant.  There are no other associated systemic symptoms, there are no other alleviating or modifying factors.   Past Medical History  Diagnosis Date  . HOH (hard of hearing)   . Cancer     hx testicular cancer-surgery-chemo-02   Past Surgical History  Procedure Laterality Date  . Testicle removal  2001    right  . Tonsillectomy  1977  . Lithotripsy  2008    cysto stone extraction  . Knee arthroscopy  2004    left  . Knee arthroscopy  2013    right  . Colonoscopy    . Open reduction internal fixation (orif) proximal phalanx Right 03/03/2013    Procedure: ORIF RIGHT SMALL FINGER P3 FRACTURE;  Surgeon: Jolyn Nap, MD;  Location: [facility];  Service: Orthopedics;  Laterality: Right;   No family history on file. History  Substance Use Topics  . Smoking status: Never Smoker   . Smokeless tobacco: Not on file  . Alcohol Use: Yes     Comment: occ    Review of Systems ROS reviewed and all otherwise negative except for mentioned in HPI    Allergies  Dilaudid  Home Medications   Prior to Admission medications   Medication Sig Start Date End Date Taking? Authorizing Provider  levothyroxine (SYNTHROID, LEVOTHROID) 75 MCG tablet Take 75 mcg by mouth daily before breakfast.   Yes Historical Provider, MD  amoxicillin-clavulanate (AUGMENTIN) 875-125 MG  per tablet Take 1 tablet by mouth every 12 (twelve) hours. 10/26/13   Ernestina Patches, MD  oxyCODONE-acetaminophen (PERCOCET/ROXICET) 5-325 MG per tablet Take 1 tablet by mouth every 4 (four) hours as needed for pain.    Historical Provider, MD  predniSONE (DELTASONE) 10 MG tablet Take 2 tablets (20 mg total) by mouth daily. Take 6, 5, 4, 3, 2, 1 for 3 days each- po qD 01/18/14   Threasa Beards, MD   BP 97/55  Pulse 66  Temp(Src) 97.6 F (36.4 C) (Oral)  Resp 20  Ht 5\' 11"  (1.803 m)  Wt 180 lb (81.647 kg)  BMI 25.12 kg/m2  SpO2 95% Vitals reviewed Physical Exam Physical Examination: General appearance - alert, well appearing, and in no distress Mental status - alert, oriented to person, place, and time Eyes - no conjunctival injection, no scleral icterus Mouth - mucous membranes moist, pharynx normal without lesions Neck - supple, no significant adenopathy Chest - clear to auscultation, no wheezes, rales or rhonchi, symmetric air entry Heart - normal rate, regular rhythm, normal S1, S2, no murmurs, rubs, clicks or gallops Extremities - peripheral pulses normal, no pedal edema, no clubbing or cyanosis Skin - normal coloration and turgor except erthematous maculopapular lesions with serous weeping on legs, arms, torso- no involvement of face  ED Course  Procedures (including critical care time) Labs Review Labs Reviewed - No data to display  Imaging Review No results found.   EKG Interpretation None      MDM   Final diagnoses:  Poison ivy dermatitis    Pt presenting with c/o itching rash c/w poison ivy.  Due to wide distribution of rash will place on steroid taper, continue po benadryl.   Patient is overall nontoxic and well hydrated in appearance.  Discharged with strict return precautions.  Pt agreeable with plan.     Threasa Beards, MD 01/20/14 [phone]

## 2014-01-18 NOTE — Discharge Instructions (Signed)
Return to the ED with any concerns including difficulty breathing, lip or tongue swelling, vomiting and not able to keep down liquids or medications, or any other alarming symptoms

## 2014-08-29 ENCOUNTER — Emergency Department (HOSPITAL_BASED_OUTPATIENT_CLINIC_OR_DEPARTMENT_OTHER)
Admission: EM | Admit: 2014-08-29 | Discharge: 2014-08-29 | Disposition: A | Discharge status: Home / Self Care | Payer: BLUE CROSS/BLUE SHIELD | Attending: Emergency Medicine | Admitting: Emergency Medicine

## 2014-08-29 ENCOUNTER — Encounter (HOSPITAL_BASED_OUTPATIENT_CLINIC_OR_DEPARTMENT_OTHER): Payer: Self-pay

## 2014-08-29 DIAGNOSIS — X58XXXA Exposure to other specified factors, initial encounter: Secondary | ICD-10-CM | POA: Diagnosis not present

## 2014-08-29 DIAGNOSIS — Z7952 Long term (current) use of systemic steroids: Secondary | ICD-10-CM | POA: Diagnosis not present

## 2014-08-29 DIAGNOSIS — H9190 Unspecified hearing loss, unspecified ear: Secondary | ICD-10-CM | POA: Diagnosis not present

## 2014-08-29 DIAGNOSIS — Z8547 Personal history of malignant neoplasm of testis: Secondary | ICD-10-CM | POA: Insufficient documentation

## 2014-08-29 DIAGNOSIS — E079 Disorder of thyroid, unspecified: Secondary | ICD-10-CM | POA: Diagnosis not present

## 2014-08-29 DIAGNOSIS — Z792 Long term (current) use of antibiotics: Secondary | ICD-10-CM | POA: Insufficient documentation

## 2014-08-29 DIAGNOSIS — Y9389 Activity, other specified: Secondary | ICD-10-CM | POA: Insufficient documentation

## 2014-08-29 DIAGNOSIS — Z87442 Personal history of urinary calculi: Secondary | ICD-10-CM | POA: Diagnosis not present

## 2014-08-29 DIAGNOSIS — Y9289 Other specified places as the place of occurrence of the external cause: Secondary | ICD-10-CM | POA: Insufficient documentation

## 2014-08-29 DIAGNOSIS — Z79899 Other long term (current) drug therapy: Secondary | ICD-10-CM | POA: Insufficient documentation

## 2014-08-29 DIAGNOSIS — S41112A Laceration without foreign body of left upper arm, initial encounter: Secondary | ICD-10-CM

## 2014-08-29 DIAGNOSIS — Y998 Other external cause status: Secondary | ICD-10-CM | POA: Insufficient documentation

## 2014-08-29 DIAGNOSIS — S71112A Laceration without foreign body, left thigh, initial encounter: Secondary | ICD-10-CM | POA: Insufficient documentation

## 2014-08-29 HISTORY — DX: Disorder of thyroid, unspecified: E07.9

## 2014-08-29 HISTORY — DX: Calculus of kidney: N20.0

## 2014-08-29 MED ORDER — LIDOCAINE-EPINEPHRINE (PF) 2 %-1:200000 IJ SOLN
20.0000 mL | Freq: Once | INTRAMUSCULAR | Status: AC
  Administered 2014-08-29: 20 mL via INTRADERMAL
  Filled 2014-08-29: qty 20

## 2014-08-29 NOTE — ED Notes (Signed)
Pt reports working outside and sustained laceration to left anterior thigh area, bleeding controlled and bandaid currently in place.

## 2014-08-29 NOTE — Discharge Instructions (Signed)
Keep wound dry and do not remove dressing for 24 hours if possible. After that, wash gently morning and night (every 12 hours) with soap and water. Use a topical antibiotic ointment and cover with a bandaid or gauze.    Do NOT use rubbing alcohol or hydrogen peroxide, do not soak the area   Present to your primary care doctor or the urgent care of your choice, or the ED for suture removal in 7-10 days.   Every attempt was made to remove foreign body (contaminants) from the wound.  However, there is always a chance that some may remain in the wound. This can  increase your risk of infection.   If you see signs of infection (warmth, redness, tenderness, pus, sharp increase in pain, fever, red streaking in the skin) immediately return to the emergency department.   After the wound heals fully, apply sunscreen for 6-12 months to minimize scarring.    Laceration Care, Adult A laceration is a cut or lesion that goes through all layers of the skin and into the tissue just beneath the skin. TREATMENT  Some lacerations may not require closure. Some lacerations may not be able to be closed due to an increased risk of infection. It is important to see your caregiver as soon as possible after an injury to minimize the risk of infection and maximize the opportunity for successful closure. If closure is appropriate, pain medicines may be given, if needed. The wound will be cleaned to help prevent infection. Your caregiver will use stitches (sutures), staples, wound glue (adhesive), or skin adhesive strips to repair the laceration. These tools bring the skin edges together to allow for faster healing and a better cosmetic outcome. However, all wounds will heal with a scar. Once the wound has healed, scarring can be minimized by covering the wound with sunscreen during the day for 1 full year. HOME CARE INSTRUCTIONS  For sutures or staples:  Keep the wound clean and dry.  If you were given a bandage  (dressing), you should change it at least once a day. Also, change the dressing if it becomes wet or dirty, or as directed by your caregiver.  Wash the wound with soap and water 2 times a day. Rinse the wound off with water to remove all soap. Pat the wound dry with a clean towel.  After cleaning, apply a thin layer of the antibiotic ointment as recommended by your caregiver. This will help prevent infection and keep the dressing from sticking.  You may shower as usual after the first 24 hours. Do not soak the wound in water until the sutures are removed.  Only take over-the-counter or prescription medicines for pain, discomfort, or fever as directed by your caregiver.  Get your sutures or staples removed as directed by your caregiver. For skin adhesive strips:  Keep the wound clean and dry.  Do not get the skin adhesive strips wet. You may bathe carefully, using caution to keep the wound dry.  If the wound gets wet, pat it dry with a clean towel.  Skin adhesive strips will fall off on their own. You may trim the strips as the wound heals. Do not remove skin adhesive strips that are still stuck to the wound. They will fall off in time. For wound adhesive:  You may briefly wet your wound in the shower or bath. Do not soak or scrub the wound. Do not swim. Avoid periods of heavy perspiration until the skin adhesive has fallen off  on its own. After showering or bathing, gently pat the wound dry with a clean towel.  Do not apply liquid medicine, cream medicine, or ointment medicine to your wound while the skin adhesive is in place. This may loosen the film before your wound is healed.  If a dressing is placed over the wound, be careful not to apply tape directly over the skin adhesive. This may cause the adhesive to be pulled off before the wound is healed.  Avoid prolonged exposure to sunlight or tanning lamps while the skin adhesive is in place. Exposure to ultraviolet light in the first  year will darken the scar.  The skin adhesive will usually remain in place for 5 to 10 days, then naturally fall off the skin. Do not pick at the adhesive film. You may need a tetanus shot if:  You cannot remember when you had your last tetanus shot.  You have never had a tetanus shot. If you get a tetanus shot, your arm may swell, get red, and feel warm to the touch. This is common and not a problem. If you need a tetanus shot and you choose not to have one, there is a rare chance of getting tetanus. Sickness from tetanus can be serious. SEEK MEDICAL CARE IF:   You have redness, swelling, or increasing pain in the wound.  You see a red line that goes away from the wound.  You have yellowish-white fluid (pus) coming from the wound.  You have a fever.  You notice a bad smell coming from the wound or dressing.  Your wound breaks open before or after sutures have been removed.  You notice something coming out of the wound such as wood or glass.  Your wound is on your hand or foot and you cannot move a finger or toe. SEEK IMMEDIATE MEDICAL CARE IF:   Your pain is not controlled with prescribed medicine.  You have severe swelling around the wound causing pain and numbness or a change in color in your arm, hand, leg, or foot.  Your wound splits open and starts bleeding.  You have worsening numbness, weakness, or loss of function of any joint around or beyond the wound.  You develop painful lumps near the wound or on the skin anywhere on your body. MAKE SURE YOU:   Understand these instructions.  Will watch your condition.  Will get help right away if you are not doing well or get worse. Document Released: 05/22/2005 Document Revised: 08/14/2011 Document Reviewed: 11/15/2010 [facility] Patient Information 2015 [place], [place]. This information is not intended to replace advice given to you by your health care provider. Make sure you discuss any questions you have with your health  care provider.

## 2014-08-29 NOTE — ED Provider Notes (Signed)
CSN: [id number]     Arrival date & time 08/29/14  1537 History   First MD Initiated Contact with Patient 08/29/14 1555     Chief Complaint  Patient presents with  . Extremity Laceration     (Consider location/radiation/quality/duration/timing/severity/associated sxs/prior Treatment) HPI  Miguel Dillon is a 59 y.o. male complaining of laceration to left thigh sustained when patient was using a gas powered hedge trimmer in a confined area just prior to arrival. States his pain is minimal, bleeding is easy to control, there was no pulsatile or bright red blood. Last tetanus was within the last year. He denies decreased range of motion to the knee or leg, difficulty walking. Denies diabetes, chronic steroid use or any other immunosuppressive condition.  Past Medical History  Diagnosis Date  . HOH (hard of hearing)   . Cancer     hx testicular [id number]  . Thyroid disease   . Kidney stone    Past Surgical History  Procedure Laterality Date  . Testicle removal  2001    right  . Tonsillectomy  1977  . Lithotripsy  2008    cysto stone extraction  . Knee arthroscopy  2004    left  . Knee arthroscopy  2013    right  . Colonoscopy    . Open reduction internal fixation (orif) proximal phalanx Right 03/03/2013    Procedure: ORIF RIGHT SMALL FINGER P3 FRACTURE;  Surgeon: Jolyn Nap, MD;  Location: [place];  Service: Orthopedics;  Laterality: Right;   No family history on file. History  Substance Use Topics  . Smoking status: Never Smoker   . Smokeless tobacco: Not on file  . Alcohol Use: No     Comment: occ    Review of Systems  10 systems reviewed and found to be negative, except as noted in the HPI.  Allergies  Dilaudid  Home Medications   Prior to Admission medications   Medication Sig Start Date End Date Taking? Authorizing Provider  amoxicillin-clavulanate (AUGMENTIN) 875-125 MG per tablet Take 1 tablet by mouth every 12 (twelve)  hours. 10/26/13   Ernestina Patches, MD  levothyroxine (SYNTHROID, LEVOTHROID) 75 MCG tablet Take 75 mcg by mouth daily before breakfast.    Historical Provider, MD  oxyCODONE-acetaminophen (PERCOCET/ROXICET) 5-325 MG per tablet Take 1 tablet by mouth every 4 (four) hours as needed for pain.    Historical Provider, MD  predniSONE (DELTASONE) 10 MG tablet Take 2 tablets (20 mg total) by mouth daily. Take 6, 5, 4, 3, 2, 1 for 3 days each- po qD 01/18/14   Alfonzo Beers, MD   BP 99/66 mmHg  Pulse 83  Temp(Src) 98.1 F (36.7 C) (Oral)  Resp 18  Ht 5\' 11"  (1.803 m)  Wt 189 lb (85.73 kg)  BMI 26.37 kg/m2  SpO2 96% Physical Exam  Constitutional: He is oriented to person, place, and time. He appears well-developed and well-nourished. No distress.  HENT:  Head: Normocephalic.  Eyes: Conjunctivae and EOM are normal.  Cardiovascular: Normal rate.   Pulmonary/Chest: Effort normal. No stridor.  Musculoskeletal: Normal range of motion.       Legs: 2 full-thickness flap-shaped lacerations just above the left knee, one is 2 cm one is 1.5 cm, no gross contamination. Hemostatic. Full range of motion to knee in flexion and extension, distally neurovascularly intact.  Neurological: He is alert and oriented to person, place, and time.  Psychiatric: He has a normal mood and affect.  Nursing note and vitals reviewed.  ED Course  LACERATION REPAIR Date/Time: 08/29/2014 4:25 PM Performed by: Monico Blitz Authorized by: Monico Blitz Consent: Verbal consent obtained. Consent given by: patient Patient identity confirmed: verbally with patient Body area: lower extremity Location details: left upper leg Laceration length: 2 cm Tendon involvement: none Nerve involvement: none Vascular damage: no Anesthesia: local infiltration Local anesthetic: lidocaine 2% with epinephrine Anesthetic total: 3 ml Patient sedated: no Irrigation solution: saline Irrigation method: syringe Amount of cleaning:  extensive Debridement: none Degree of undermining: minimal Skin closure: Ethilon (4-0) Number of sutures: 3 Technique: simple (2 simple, 1 corner stich) Approximation: close Approximation difficulty: simple Dressing: antibiotic ointment and gauze roll Patient tolerance: Patient tolerated the procedure well with no immediate complications    LACERATION REPAIR Date/Time: 08/29/2014 4:25 PM Performed by: Monico Blitz Authorized by: Monico Blitz Consent: Verbal consent obtained. Consent given by: patient Patient identity confirmed: verbally with patient Body area: lower extremity Location details: left upper leg Laceration length: 1.5 cm Tendon involvement: none Nerve involvement: none Vascular damage: no Anesthesia: local infiltration Local anesthetic: lidocaine 2% with epinephrine Anesthetic total: 3 ml Patient sedated: no Irrigation solution: saline Irrigation method: syringe Amount of cleaning: extensive Debridement: none Degree of undermining: minimal Skin closure: Ethilon (4-0) Number of sutures: 2 Technique: simple (1 simple, 1 corner stich) Approximation: close Approximation difficulty: simple Dressing: antibiotic ointment and gauze roll Patient tolerance: Patient tolerated the procedure well with no immediate complications  Labs Review Labs Reviewed - No data to display  Imaging Review No results found.   EKG Interpretation None      MDM   Final diagnoses:  Lacerations of multiple sites of left arm, initial encounter    Filed Vitals:   08/29/14 1541 08/29/14 1633  BP: 99/66 114/65  Pulse: 83 78  Temp: 98.1 F (36.7 C)   TempSrc: Oral   Resp: 18 16  Height: 5\' 11"  (1.803 m)   Weight: 189 lb (85.73 kg)   SpO2: 96% 95%    Medications  lidocaine-EPINEPHrine (XYLOCAINE W/EPI) 2 %-1:200000 (PF) injection 20 mL (20 mLs Intradermal Given by Other 08/29/14 1606)    Miguel Dillon is a pleasant 59 y.o. male presenting with laceration to  left leg. Wounds are irrigated, tetanus is up-to-date and laceration is closed with nonabsorbable suture, advised patient on wound care and return precautions for infection.   Evaluation does not show pathology that would require ongoing emergent intervention or inpatient treatment. Pt is hemodynamically stable and mentating appropriately. Discussed findings and plan with patient/guardian, who agrees with care plan. All questions answered. Return precautions discussed and outpatient follow up given.        Monico Blitz, PA-C 08/29/14 [place], MD 08/29/14 [phone]
